# Patient Record
Sex: Male | Born: 1955 | Race: Black or African American | Hispanic: No | Marital: Married | Smoking: Never smoker
Health system: Southern US, Community
[De-identification: ages and names within clinical notes are randomized; demographics above are authoritative.]

---

## 2002-12-28 ENCOUNTER — Emergency Department (HOSPITAL_COMMUNITY): Admission: EM | Admit: 2002-12-28 | Discharge: 2002-12-28 | Payer: Self-pay | Admitting: Emergency Medicine

## 2004-01-12 ENCOUNTER — Emergency Department (HOSPITAL_COMMUNITY): Admission: EM | Admit: 2004-01-12 | Discharge: 2004-01-12 | Payer: Self-pay | Admitting: Emergency Medicine

## 2004-01-25 ENCOUNTER — Emergency Department (HOSPITAL_COMMUNITY): Admission: EM | Admit: 2004-01-25 | Discharge: 2004-01-25 | Payer: Self-pay | Admitting: Emergency Medicine

## 2004-01-28 ENCOUNTER — Emergency Department (HOSPITAL_COMMUNITY): Admission: EM | Admit: 2004-01-28 | Discharge: 2004-01-28 | Payer: Self-pay | Admitting: Emergency Medicine

## 2005-04-06 ENCOUNTER — Emergency Department (HOSPITAL_COMMUNITY): Admission: EM | Admit: 2005-04-06 | Discharge: 2005-04-06 | Payer: Self-pay | Admitting: Emergency Medicine

## 2005-09-04 ENCOUNTER — Emergency Department (HOSPITAL_COMMUNITY): Admission: EM | Admit: 2005-09-04 | Discharge: 2005-09-04 | Payer: Self-pay | Admitting: *Deleted

## 2005-10-08 ENCOUNTER — Ambulatory Visit (HOSPITAL_COMMUNITY): Admission: RE | Admit: 2005-10-08 | Discharge: 2005-10-08 | Payer: Self-pay | Admitting: Gastroenterology

## 2008-04-25 ENCOUNTER — Emergency Department (HOSPITAL_COMMUNITY): Admission: EM | Admit: 2008-04-25 | Discharge: 2008-04-25 | Payer: Self-pay | Admitting: Emergency Medicine

## 2008-04-29 ENCOUNTER — Encounter: Admission: RE | Admit: 2008-04-29 | Discharge: 2008-04-29 | Payer: Self-pay | Admitting: Orthopedic Surgery

## 2008-08-31 ENCOUNTER — Emergency Department (HOSPITAL_COMMUNITY): Admission: EM | Admit: 2008-08-31 | Discharge: 2008-08-31 | Payer: Self-pay | Admitting: Emergency Medicine

## 2009-07-08 ENCOUNTER — Emergency Department (HOSPITAL_COMMUNITY): Admission: EM | Admit: 2009-07-08 | Discharge: 2009-07-08 | Payer: Self-pay | Admitting: Emergency Medicine

## 2009-08-08 ENCOUNTER — Emergency Department (HOSPITAL_COMMUNITY): Admission: EM | Admit: 2009-08-08 | Discharge: 2009-08-08 | Payer: Self-pay | Admitting: Emergency Medicine

## 2009-08-17 ENCOUNTER — Encounter: Admission: RE | Admit: 2009-08-17 | Discharge: 2009-08-17 | Payer: Self-pay | Admitting: Otolaryngology

## 2010-12-03 LAB — POCT I-STAT, CHEM 8
BUN: 9 mg/dL (ref 6–23)
Creatinine, Ser: 0.8 mg/dL (ref 0.4–1.5)
Potassium: 4.3 mEq/L (ref 3.5–5.1)
Sodium: 139 mEq/L (ref 135–145)
TCO2: 23 mmol/L (ref 0–100)

## 2010-12-15 LAB — CBC
Hemoglobin: 14 g/dL (ref 13.0–17.0)
MCHC: 32.4 g/dL (ref 30.0–36.0)
MCV: 85.9 fL (ref 78.0–100.0)
RBC: 5.03 MIL/uL (ref 4.22–5.81)
WBC: 8.8 10*3/uL (ref 4.0–10.5)

## 2011-01-16 NOTE — Consult Note (Signed)
NAME:  Joel Durham, Joel Durham NO.:  [id number]   MEDICAL RECORD NO.:  [id number]          PATIENT TYPE:  EMS   LOCATION:  ED                           FACILITY:  [facility]   PHYSICIAN:  Jordan Hawks. Elnoria Howard, MD    DATE OF BIRTH:  [birth year]   DATE OF CONSULTATION:  09/04/2005  DATE OF DISCHARGE:                                   CONSULTATION   REFERRING PHYSICIAN:  Sheppard Penton. Stacie Acres, M.D.   REASON FOR CONSULTATION:  Hematochezia.   HISTORY OF PRESENT ILLNESS:  This is a 55 year old, black male with no  significant past medical history who presents with acute onset of  hematochezia that started last evening.  The patient states that he was in  his usual state of health and suddenly developed painless hematochezia.  He  denies having any prior evidence of bleeding before this time.  He denies  having any bowel movements with the hematochezia or having to strain.  His  last bowel movement was on this past Tuesday and it was normal without any  difficulties.  The patient denies any family history of colon cancer,  diarrhea, fevers, chills, arthritis or arthralgias.  Overall, the patient  states that he is in good health.   PAST MEDICAL HISTORY:  As stated above.   PAST SURGICAL HISTORY:  As stated above.   FAMILY HISTORY:  Negative for colon cancer, however, his sister has some  type of cancer, but he is not aware of the source.   SOCIAL HISTORY:  The patient is married and works as a Chartered certified accountant.   REVIEW OF SYSTEMS:  As stated above in the history of present illness and  also negative for any dizziness, blurry vision, headaches, dysarthria, chest  pain, shortness of breath, abdominal pain.   ALLERGIES:  No known drug allergies.   MEDICATIONS:  None.   PHYSICAL EXAMINATION:  VITAL SIGNS:  Stable.  GENERAL:  The patient is in no acute distress, alert and oriented.  HEENT:  Normocephalic, atraumatic.  Extraocular movements intact.  Pupils  equal round and reactive to light.  NECK:  Supple with no lymphadenopathy.  LUNGS:  Clear to auscultation bilaterally.  CARDIAC:  Regular rate and rhythm without murmurs, rubs or gallops.  ABDOMEN:  Obese, soft, nontender, nondistended.  Positive bowel sounds.  EXTREMITIES:  No clubbing, cyanosis or edema.  RECTAL:  Negative for any palpable masses, however, there is gross blood on  the examination glove.   LABORATORY DATA AND X-RAY FINDINGS:  Hemoglobin in the 13 range.   IMPRESSION:  Painless hematochezia.  I am uncertain about the source at this  time, however, appears to be of a lower gastrointestinal source.  Differential diagnoses includes colitis, rectal or colon cancer,  hemorrhoidal bleed or possible anal fissure.  Further examination is  required with a flexible sigmoidoscopy.   RECOMMENDATIONS:  Flexible sigmoidoscopy.      Jordan Hawks Elnoria Howard, MD  Electronically Signed     PDH/MEDQ  D:  09/04/2005  T:  09/05/2005  Job:  [id number]

## 2011-09-16 ENCOUNTER — Telehealth: Payer: Self-pay | Admitting: Family Medicine

## 2011-09-16 NOTE — Telephone Encounter (Signed)
Ok per Dr. Durene Cal to give patient letter allowing him to return to work today (see hosp d/c summary).  Left detailed message on patient's voice mail---letter faxed to his employer at [phone] (attn: Joni Reining).  Gaylene Brooks, RN

## 2011-09-16 NOTE — Telephone Encounter (Signed)
States that he needs a realse letter today to be faxed to his job - will get FMLA papers to [place] asap, but needs that release so he can go to work this afternoon. pls fax to [employer] - [phone]

## 2012-03-25 ENCOUNTER — Encounter (HOSPITAL_COMMUNITY): Payer: Self-pay

## 2012-03-25 ENCOUNTER — Emergency Department (HOSPITAL_COMMUNITY): Payer: 59

## 2012-03-25 ENCOUNTER — Emergency Department (HOSPITAL_COMMUNITY)
Admission: EM | Admit: 2012-03-25 | Discharge: 2012-03-25 | Disposition: A | Discharge status: Home Health | Payer: 59 | Attending: Emergency Medicine | Admitting: Emergency Medicine

## 2012-03-25 DIAGNOSIS — Z043 Encounter for examination and observation following other accident: Secondary | ICD-10-CM | POA: Insufficient documentation

## 2012-03-25 DIAGNOSIS — M25569 Pain in unspecified knee: Secondary | ICD-10-CM

## 2012-03-25 DIAGNOSIS — R079 Chest pain, unspecified: Secondary | ICD-10-CM | POA: Insufficient documentation

## 2012-03-25 MED ORDER — OXYCODONE-ACETAMINOPHEN 5-325 MG PO TABS: 1.0000 | ORAL_TABLET | Freq: Three times a day (TID) | ORAL | Status: AC | PRN
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
## 2012-03-25 NOTE — ED Notes (Signed)
Pt able to amb around department without problem. No chest pain or shob.
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
## 2012-04-15 ENCOUNTER — Emergency Department (HOSPITAL_COMMUNITY)
Admission: EM | Admit: 2012-04-15 | Discharge: 2012-04-15 | Disposition: A | Discharge status: Home / Self Care | Payer: 59 | Attending: Emergency Medicine | Admitting: Emergency Medicine

## 2012-04-15 ENCOUNTER — Encounter (HOSPITAL_COMMUNITY): Payer: Self-pay | Admitting: *Deleted

## 2012-04-15 DIAGNOSIS — M7120 Synovial cyst of popliteal space [Baker], unspecified knee: Secondary | ICD-10-CM | POA: Insufficient documentation

## 2012-04-15 MED ORDER — FLUORESCEIN SODIUM 1 MG OP STRP
ORAL_STRIP | OPHTHALMIC | Status: AC
  Filled 2012-04-15: qty 1

## 2012-04-15 MED ORDER — TETRACAINE HCL 0.5 % OP SOLN
OPHTHALMIC | Status: AC
  Filled 2012-04-15: qty 2
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
## 2012-04-15 NOTE — Progress Notes (Signed)
Orthopedic Tech Progress Note Patient Details:  Joel Durham Jan 04, 1956 [id number]  Ortho Devices Type of Ortho Device: Knee Sleeve Ortho Device/Splint Interventions: Application   Cammer, Mickie Bail 04/15/2012, 1:02 PM

## 2012-04-15 NOTE — ED Notes (Signed)
Pt reports noticed knot behind left knee yesterday, reports pain with palpation and ambulation. No new numbness or tingling distal to knot. Small knot felt on palpation. ROM on knee intact no pain with palpation to calf, no swelling noted.
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
## 2019-06-27 ENCOUNTER — Other Ambulatory Visit: Payer: Self-pay

## 2019-06-27 DIAGNOSIS — Z20822 Contact with and (suspected) exposure to covid-19: Secondary | ICD-10-CM

## 2019-06-29 LAB — NOVEL CORONAVIRUS, NAA: SARS-CoV-2, NAA: NOT DETECTED

## 2021-05-25 ENCOUNTER — Other Ambulatory Visit: Payer: Self-pay

## 2021-05-25 ENCOUNTER — Emergency Department (HOSPITAL_COMMUNITY)
Admission: EM | Admit: 2021-05-25 | Discharge: 2021-05-25 | Disposition: A | Discharge status: Home / Self Care | Payer: Self-pay | Attending: Emergency Medicine | Admitting: Emergency Medicine

## 2021-05-25 DIAGNOSIS — I83899 Varicose veins of unspecified lower extremities with other complications: Secondary | ICD-10-CM

## 2021-05-25 DIAGNOSIS — X58XXXA Exposure to other specified factors, initial encounter: Secondary | ICD-10-CM | POA: Insufficient documentation

## 2021-05-25 DIAGNOSIS — I83892 Varicose veins of left lower extremities with other complications: Secondary | ICD-10-CM | POA: Insufficient documentation

## 2021-05-25 DIAGNOSIS — S71112A Laceration without foreign body, left thigh, initial encounter: Secondary | ICD-10-CM | POA: Insufficient documentation

## 2021-05-25 NOTE — ED Provider Notes (Signed)
[facility]-EMERGENCY DEPT Provider Note   CSN: [id number] Arrival date & time: 05/25/21  2037     History Chief Complaint  Patient presents with   bleeding    Joel Durham is a 65 y.o. male.  He began bleeding from his left lateral thigh at 8 PM.  This occurred spontaneously.  No pain.  No exacerbating or relieving factors.  He has tried to dress it, and it still has soaked through his clothes. He does not use blood thinners.  The history is provided by the patient.      No past medical history on file.  There are no problems to display for this patient.   No past surgical history on file.     No family history on file.  Social History   Tobacco Use   Smoking status: Never  Substance Use Topics   Alcohol use: No   Drug use: No    Home Medications Prior to Admission medications   Not on File    Allergies    Patient has no known allergies.  Review of Systems   Review of Systems  Constitutional:  Negative for chills and fever.  HENT:  Negative for ear pain and sore throat.   Eyes:  Negative for pain and visual disturbance.  Respiratory:  Negative for cough and shortness of breath.   Cardiovascular:  Negative for chest pain and palpitations.  Gastrointestinal:  Negative for abdominal pain and vomiting.  Genitourinary:  Negative for dysuria and hematuria.  Musculoskeletal:  Negative for arthralgias and back pain.  Skin:  Negative for color change and rash.  Neurological:  Negative for seizures and syncope.  All other systems reviewed and are negative.  Physical Exam Updated Vital Signs BP (!) 163/90 (BP Location: Left Arm)   Pulse 90   Temp 98 F (36.7 C) (Oral)   Resp 16   Ht 5\' 4"  (1.626 m)   Wt 112 kg   SpO2 94%   BMI 42.40 kg/m   Physical Exam Vitals and nursing note reviewed.  Constitutional:      Appearance: Normal appearance.  HENT:     Head: Normocephalic and atraumatic.  Eyes:     Conjunctiva/sclera:  Conjunctivae normal.  Pulmonary:     Effort: Pulmonary effort is normal. No respiratory distress.  Musculoskeletal:        General: No deformity. Normal range of motion.     Cervical back: Normal range of motion.     Comments: Small actively bleeding vessel from left lateral thigh  Skin:    General: Skin is warm and dry.  Neurological:     General: No focal deficit present.     Mental Status: He is alert and oriented to person, place, and time. Mental status is at baseline.  Psychiatric:        Mood and Affect: Mood normal.    ED Results / Procedures / Treatments   Labs (all labs ordered are listed, but only abnormal results are displayed) Labs Reviewed - No data to display  EKG None  Radiology No results found.  Procedures . Laceration Repair  Date/Time: 05/25/2021 10:02 PM Performed by: 05/27/2021, MD Authorized by: Koleen Distance, MD   Consent:    Consent obtained:  Verbal   Consent given by:  Patient   Risks, benefits, and alternatives were discussed: yes     Risks discussed:  Infection, pain, need for additional repair, poor cosmetic result and poor wound healing  Alternatives discussed:  No treatment, delayed treatment, observation and referral Universal protocol:    Immediately prior to procedure, a time out was called: yes     Patient identity confirmed:  Verbally with patient Anesthesia:    Anesthesia method:  None Laceration details:    Location:  Leg   Leg location:  L upper leg   Length (cm):  0.3   Depth (mm):  1 Exploration:    Hemostasis achieved with:  Tourniquet   Contaminated: no   Treatment:    Debridement:  None   Undermining:  None Skin repair:    Repair method:  Tissue adhesive Approximation:    Approximation:  Close Repair type:    Repair type:  Simple Post-procedure details:    Dressing:  Bulky dressing   Procedure completion:  Tolerated well, no immediate complications Comments:     Tourniquet was applied distally to stop  the venous bleeding.  Dermabond was applied to stop the 2 small bleeding varicosities.  Compression dressing was applied.   Medications Ordered in ED Medications - No data to display  ED Course  I have reviewed the triage vital signs and the nursing notes.  Pertinent labs & imaging results that were available during my care of the patient were reviewed by me and considered in my medical decision making (see chart for details).    MDM Rules/Calculators/A&P                           Joel Durham presents with a bleeding varicose vein on his left thigh.  This was closed with dermabond, and he was given instructions on wound care. Final Clinical Impression(s) / ED Diagnoses Final diagnoses:  Bleeding from varicose vein    Rx / DC Orders ED Discharge Orders     None        Koleen Distance, MD 05/25/21 2204

## 2021-05-25 NOTE — ED Triage Notes (Addendum)
Pt came in bleeding from his left leg. Pt does not know why he is bleeding. He states that he was taking the trash out and came back inside and noticed blood on the floor. Towel is covered in blood. Pt has a small opening to his left lateral thigh.

## 2021-05-25 NOTE — ED Notes (Signed)
Pt has bleeding controlled with an abd pad and coban, had a vericose vein that burst at about 8 pm

## 2021-11-10 DIAGNOSIS — Z0100 Encounter for examination of eyes and vision without abnormal findings: Secondary | ICD-10-CM | POA: Diagnosis not present

## 2022-01-19 DIAGNOSIS — R7303 Prediabetes: Secondary | ICD-10-CM | POA: Diagnosis not present

## 2022-01-19 DIAGNOSIS — Z1389 Encounter for screening for other disorder: Secondary | ICD-10-CM | POA: Diagnosis not present

## 2022-01-19 DIAGNOSIS — Z125 Encounter for screening for malignant neoplasm of prostate: Secondary | ICD-10-CM | POA: Diagnosis not present

## 2022-01-19 DIAGNOSIS — Z23 Encounter for immunization: Secondary | ICD-10-CM | POA: Diagnosis not present

## 2022-01-19 DIAGNOSIS — Z0000 Encounter for general adult medical examination without abnormal findings: Secondary | ICD-10-CM | POA: Diagnosis not present

## 2022-01-19 DIAGNOSIS — Z79899 Other long term (current) drug therapy: Secondary | ICD-10-CM | POA: Diagnosis not present

## 2022-01-19 DIAGNOSIS — E7800 Pure hypercholesterolemia, unspecified: Secondary | ICD-10-CM | POA: Diagnosis not present

## 2022-02-12 DIAGNOSIS — E7800 Pure hypercholesterolemia, unspecified: Secondary | ICD-10-CM | POA: Diagnosis not present

## 2022-02-12 DIAGNOSIS — Z0000 Encounter for general adult medical examination without abnormal findings: Secondary | ICD-10-CM | POA: Diagnosis not present

## 2022-02-12 DIAGNOSIS — Z6841 Body Mass Index (BMI) 40.0 and over, adult: Secondary | ICD-10-CM | POA: Diagnosis not present

## 2022-02-12 DIAGNOSIS — R7303 Prediabetes: Secondary | ICD-10-CM | POA: Diagnosis not present

## 2022-02-12 DIAGNOSIS — N529 Male erectile dysfunction, unspecified: Secondary | ICD-10-CM | POA: Diagnosis not present

## 2022-02-12 DIAGNOSIS — Z79899 Other long term (current) drug therapy: Secondary | ICD-10-CM | POA: Diagnosis not present

## 2022-02-12 DIAGNOSIS — N400 Enlarged prostate without lower urinary tract symptoms: Secondary | ICD-10-CM | POA: Diagnosis not present

## 2022-02-12 DIAGNOSIS — R972 Elevated prostate specific antigen [PSA]: Secondary | ICD-10-CM | POA: Diagnosis not present

## 2022-10-19 DIAGNOSIS — Z961 Presence of intraocular lens: Secondary | ICD-10-CM | POA: Diagnosis not present

## 2022-10-19 DIAGNOSIS — H26493 Other secondary cataract, bilateral: Secondary | ICD-10-CM | POA: Diagnosis not present

## 2022-12-16 DIAGNOSIS — H26493 Other secondary cataract, bilateral: Secondary | ICD-10-CM | POA: Diagnosis not present

## 2023-01-15 DIAGNOSIS — H26491 Other secondary cataract, right eye: Secondary | ICD-10-CM | POA: Diagnosis not present

## 2023-02-10 DIAGNOSIS — N401 Enlarged prostate with lower urinary tract symptoms: Secondary | ICD-10-CM | POA: Diagnosis not present

## 2023-02-10 DIAGNOSIS — R351 Nocturia: Secondary | ICD-10-CM | POA: Diagnosis not present

## 2023-02-10 DIAGNOSIS — R972 Elevated prostate specific antigen [PSA]: Secondary | ICD-10-CM | POA: Diagnosis not present

## 2023-05-25 DIAGNOSIS — Z23 Encounter for immunization: Secondary | ICD-10-CM | POA: Diagnosis not present

## 2023-05-25 DIAGNOSIS — Z0000 Encounter for general adult medical examination without abnormal findings: Secondary | ICD-10-CM | POA: Diagnosis not present

## 2023-05-25 DIAGNOSIS — Z6841 Body Mass Index (BMI) 40.0 and over, adult: Secondary | ICD-10-CM | POA: Diagnosis not present

## 2023-05-25 DIAGNOSIS — Z1331 Encounter for screening for depression: Secondary | ICD-10-CM | POA: Diagnosis not present

## 2023-08-02 DIAGNOSIS — N529 Male erectile dysfunction, unspecified: Secondary | ICD-10-CM | POA: Diagnosis not present

## 2023-08-02 DIAGNOSIS — N400 Enlarged prostate without lower urinary tract symptoms: Secondary | ICD-10-CM | POA: Diagnosis not present

## 2023-08-02 DIAGNOSIS — R7303 Prediabetes: Secondary | ICD-10-CM | POA: Diagnosis not present

## 2023-08-02 DIAGNOSIS — Z6841 Body Mass Index (BMI) 40.0 and over, adult: Secondary | ICD-10-CM | POA: Diagnosis not present

## 2023-08-02 DIAGNOSIS — E7800 Pure hypercholesterolemia, unspecified: Secondary | ICD-10-CM | POA: Diagnosis not present

## 2024-02-01 DIAGNOSIS — R972 Elevated prostate specific antigen [PSA]: Secondary | ICD-10-CM | POA: Diagnosis not present

## 2024-02-01 DIAGNOSIS — N400 Enlarged prostate without lower urinary tract symptoms: Secondary | ICD-10-CM | POA: Diagnosis not present

## 2024-02-01 DIAGNOSIS — Z6841 Body Mass Index (BMI) 40.0 and over, adult: Secondary | ICD-10-CM | POA: Diagnosis not present

## 2024-02-01 DIAGNOSIS — Z79899 Other long term (current) drug therapy: Secondary | ICD-10-CM | POA: Diagnosis not present

## 2024-02-01 DIAGNOSIS — N529 Male erectile dysfunction, unspecified: Secondary | ICD-10-CM | POA: Diagnosis not present

## 2024-02-01 DIAGNOSIS — E7800 Pure hypercholesterolemia, unspecified: Secondary | ICD-10-CM | POA: Diagnosis not present

## 2024-02-01 DIAGNOSIS — R7303 Prediabetes: Secondary | ICD-10-CM | POA: Diagnosis not present

## 2024-06-02 DIAGNOSIS — Z23 Encounter for immunization: Secondary | ICD-10-CM | POA: Diagnosis not present

## 2024-06-30 DIAGNOSIS — Z823 Family history of stroke: Secondary | ICD-10-CM | POA: Diagnosis not present

## 2024-06-30 DIAGNOSIS — Z809 Family history of malignant neoplasm, unspecified: Secondary | ICD-10-CM | POA: Diagnosis not present

## 2024-06-30 DIAGNOSIS — E785 Hyperlipidemia, unspecified: Secondary | ICD-10-CM | POA: Diagnosis not present

## 2024-06-30 DIAGNOSIS — Z8249 Family history of ischemic heart disease and other diseases of the circulatory system: Secondary | ICD-10-CM | POA: Diagnosis not present

## 2024-06-30 DIAGNOSIS — Z9181 History of falling: Secondary | ICD-10-CM | POA: Diagnosis not present

## 2024-06-30 DIAGNOSIS — N529 Male erectile dysfunction, unspecified: Secondary | ICD-10-CM | POA: Diagnosis not present

## 2024-06-30 DIAGNOSIS — E669 Obesity, unspecified: Secondary | ICD-10-CM | POA: Diagnosis not present

## 2024-06-30 DIAGNOSIS — N400 Enlarged prostate without lower urinary tract symptoms: Secondary | ICD-10-CM | POA: Diagnosis not present

## 2024-06-30 DIAGNOSIS — Z833 Family history of diabetes mellitus: Secondary | ICD-10-CM | POA: Diagnosis not present
